# Patient Record
Sex: Male | Born: 1955 | Race: White | Hispanic: No | Marital: Single | Smoking: Never smoker
Health system: Southern US, Community
[De-identification: ages and names within clinical notes are randomized; demographics above are authoritative.]

## PROBLEM LIST (undated history)

## (undated) DIAGNOSIS — E7800 Pure hypercholesterolemia, unspecified: Secondary | ICD-10-CM

## (undated) DIAGNOSIS — T7840XA Allergy, unspecified, initial encounter: Secondary | ICD-10-CM

## (undated) HISTORY — PX: COLONOSCOPY W/ POLYPECTOMY: SHX1380

## (undated) HISTORY — PX: TONSILLECTOMY: SUR1361

## (undated) HISTORY — DX: Allergy, unspecified, initial encounter: T78.40XA

## (undated) HISTORY — PX: WISDOM TOOTH EXTRACTION: SHX21

## (undated) HISTORY — DX: Pure hypercholesterolemia, unspecified: E78.00

---

## 2003-10-24 ENCOUNTER — Encounter: Admission: RE | Admit: 2003-10-24 | Discharge: 2003-10-24 | Payer: Self-pay | Admitting: Family Medicine

## 2003-11-12 ENCOUNTER — Encounter: Admission: RE | Admit: 2003-11-12 | Discharge: 2003-11-12 | Payer: Self-pay | Admitting: Orthopedic Surgery

## 2009-05-13 ENCOUNTER — Emergency Department (HOSPITAL_COMMUNITY): Admission: EM | Admit: 2009-05-13 | Discharge: 2009-05-13 | Payer: Self-pay | Admitting: Emergency Medicine

## 2009-09-23 IMAGING — CT CT PELVIS W/O CM
2 of 4 series · 13 of 32 positions shown, 18 images · non-contrast
Comparison: None

CT ABDOMEN

CLINICAL DATA: Right-sided pain, nausea and vomiting.

CT ABDOMEN AND PELVIS WITHOUT CONTRAST
TECHNIQUE: Multidetector CT imaging of the abdomen and pelvis was
performed using the standard protocol without use of intravenous
contrast.

[Series 2: renal stone · axial · 0.75mm/px · z∈[-460,-144]mm · 5 of 94 slices shown, 10 images]
[im 16/94  soft-tissue]
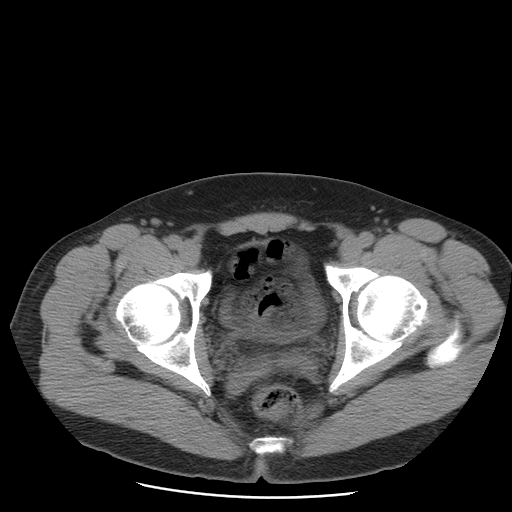
[im 16/94  bone]
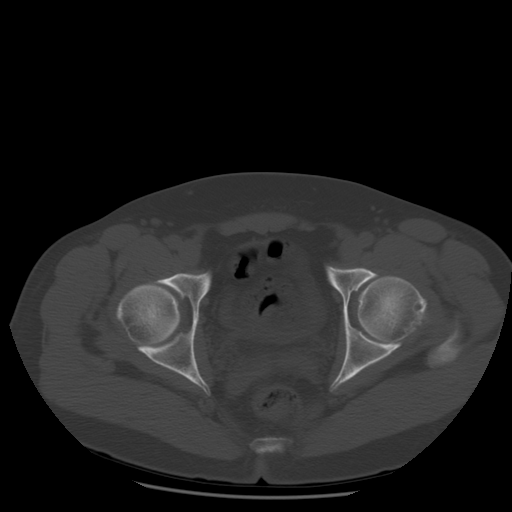
[im 32/94  soft-tissue]
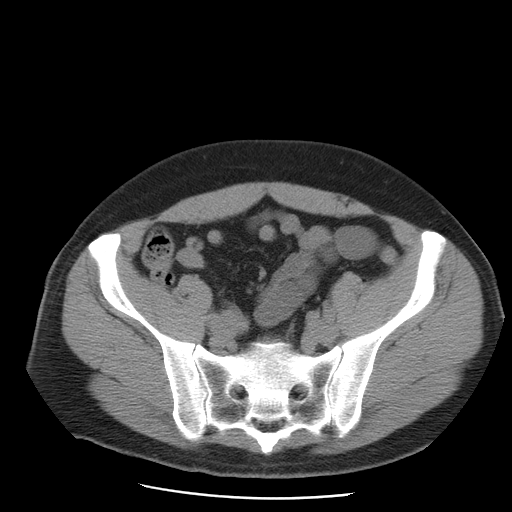
[im 32/94  lung]
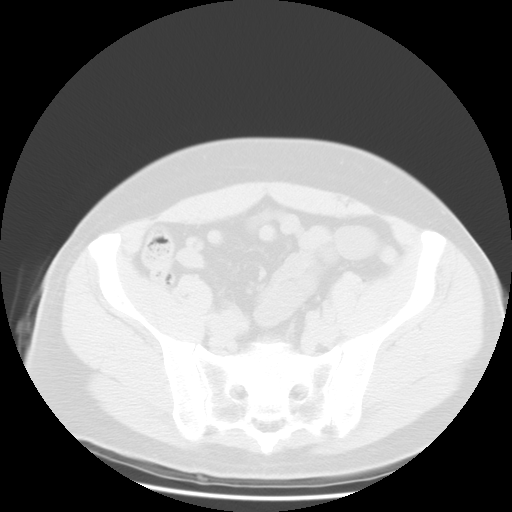
[im 47/94  soft-tissue]
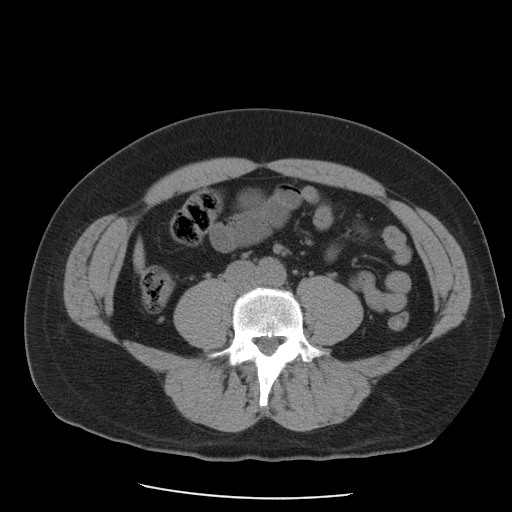
[im 47/94  lung]
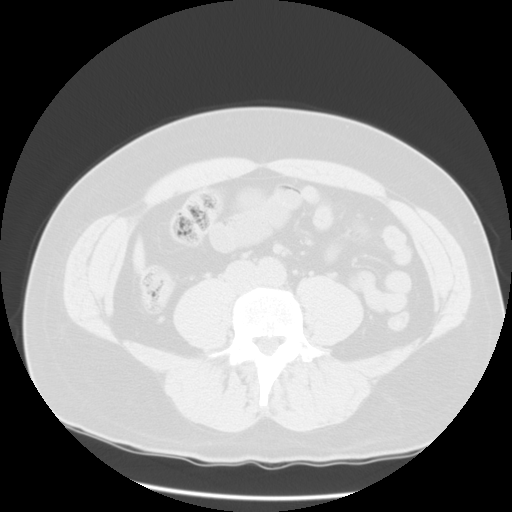
[im 63/94  soft-tissue]
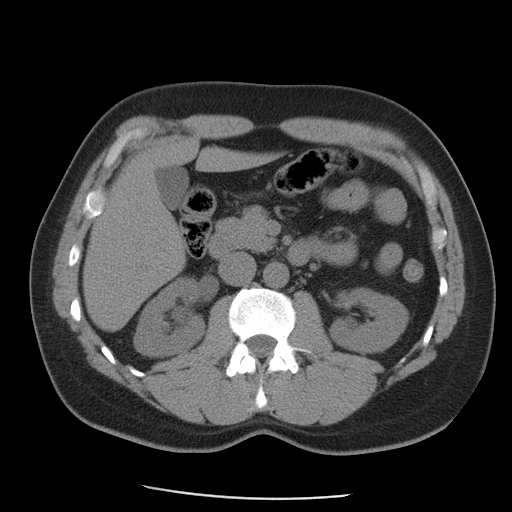
[im 63/94  lung]
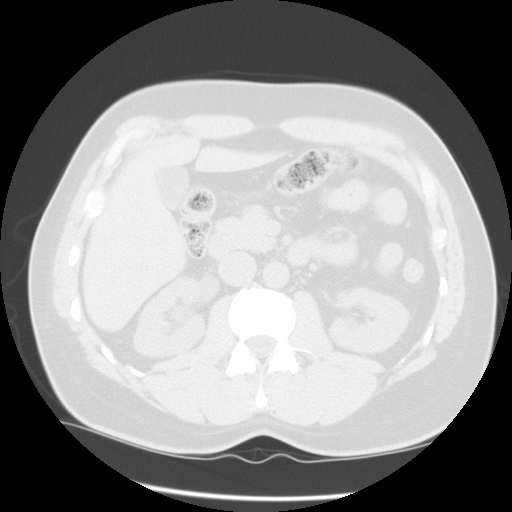
[im 78/94  soft-tissue]
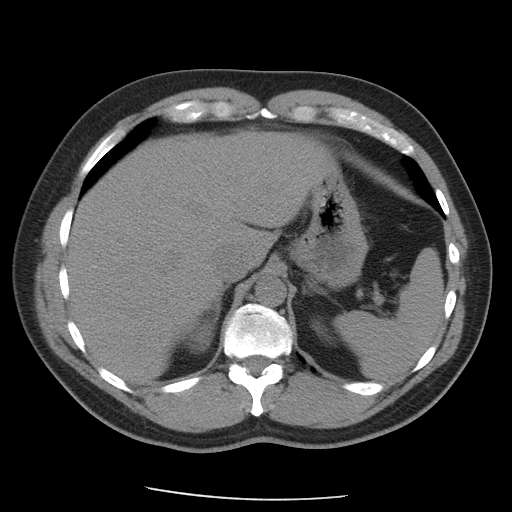
[im 78/94  lung]
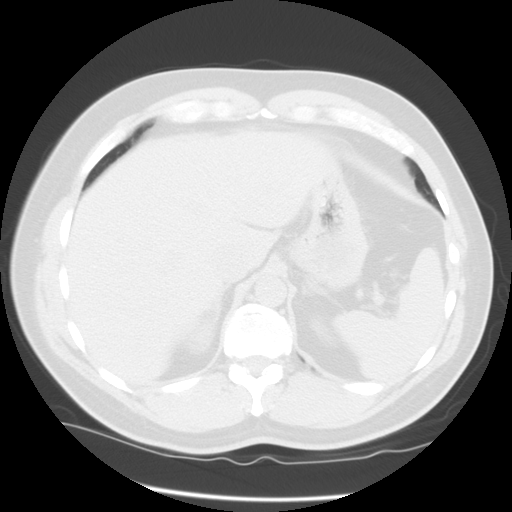

[Series 400: reformatted · sagittal · 0.97mm/px · 8 of 178 slices shown]
[im 15/178  soft-tissue]
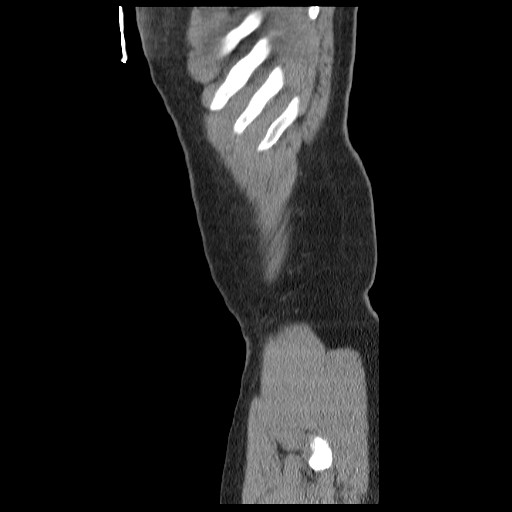
[im 45/178  soft-tissue]
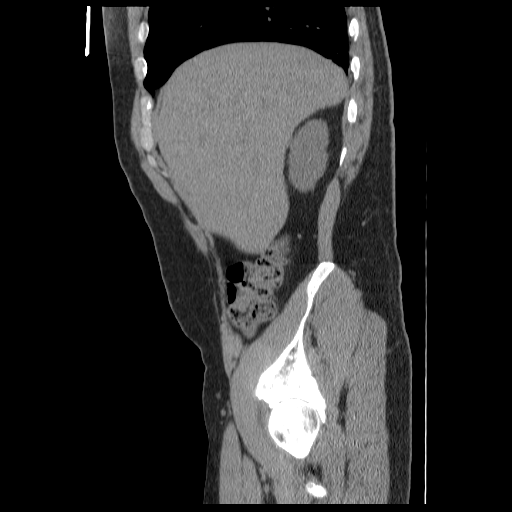
[im 60/178  soft-tissue]
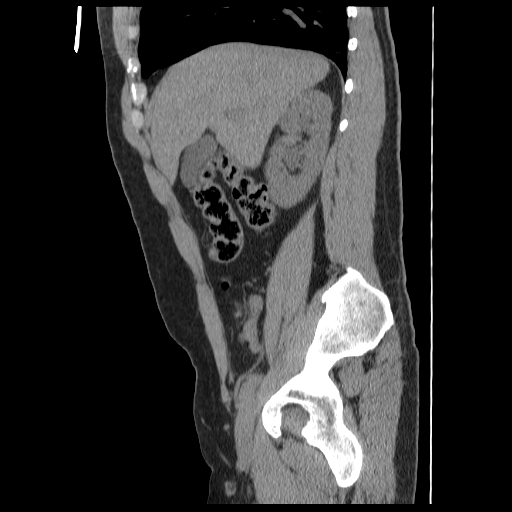
[im 74/178  soft-tissue]
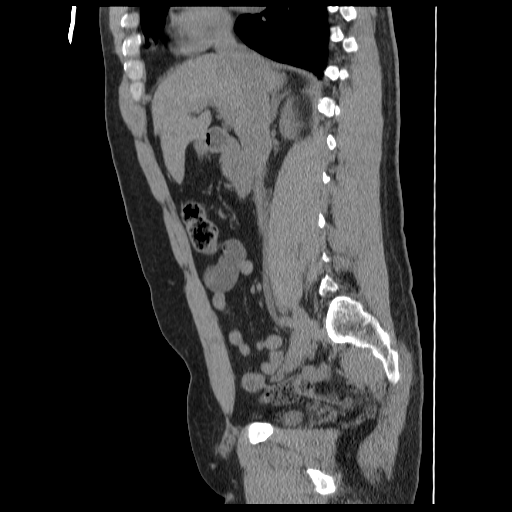
[im 104/178  soft-tissue]
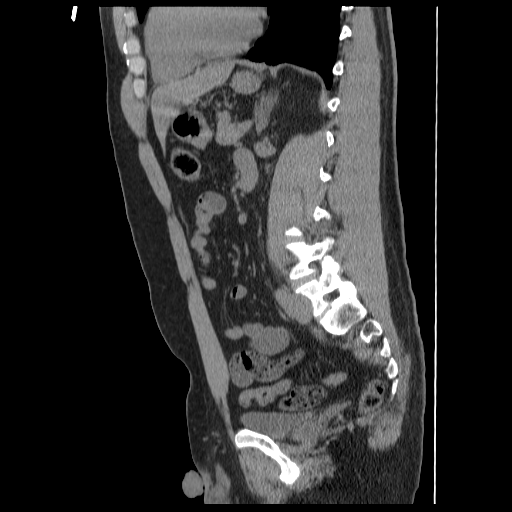
[im 119/178  soft-tissue]
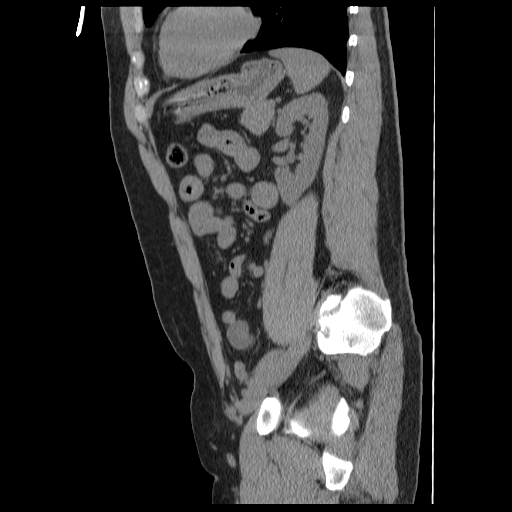
[im 133/178  soft-tissue]
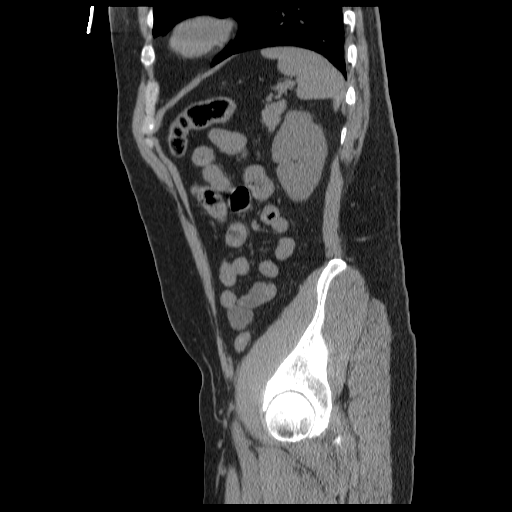
[im 163/178  soft-tissue]
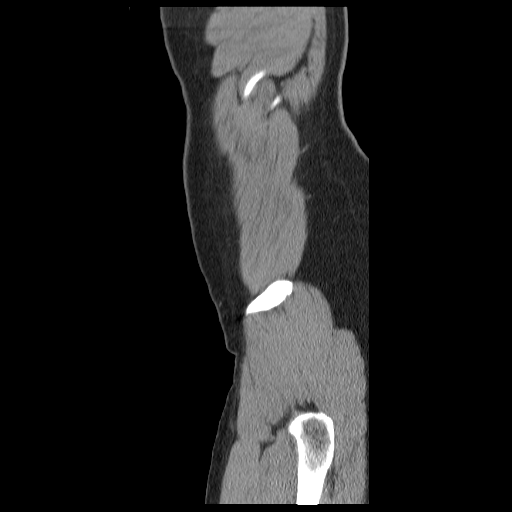

[13 of 32 positions shown; findings below may reference images not displayed]

FINDINGS: The visualized lung bases are clear.

The liver and spleen are unremarkable in appearance.  The
gallbladder is within normal limits.  The pancreas and adrenal
glands are unremarkable.

There is mild right-sided hydronephrosis, with prominence of the
right ureter along much of its course, to the level of a 3-4 mm
either obstructing or recently passed stone at the right
vesicoureteral junction. No nonobstructing renal stones are
identified.  The left kidney is unremarkable in appearance.

No free fluid is identified.  The small bowel is unremarkable in
appearance.  The stomach is within normal limits.  No acute
vascular abnormalities are seen.

No acute osseous abnormalities are identified.
IMPRESSION: Mild right-sided hydronephrosis and prominence of the right ureter,
with a 3-4 mm stone noted at the right vesicoureteral junction
along the posterior bladder wall.  This may be either an
obstructing stone or a recently passed stone. No additional stones
identified.

CT PELVIS
FINDINGS: No free fluid is identified within the pelvis.  The
colon is unremarkable in appearance; the sigmoid colon is redundant
within the pelvis.  The appendix is normal in caliber, and contains
air.

The bladder is largely decompressed and within normal limits. The
prostate is unremarkable in appearance.  There is no evidence of
inguinal lymphadenopathy.

No acute osseous abnormalities are seen.
IMPRESSION: Unremarkable CT of the pelvis.

## 2010-12-03 LAB — COMPREHENSIVE METABOLIC PANEL
ALT: 27 U/L (ref 0–53)
Alkaline Phosphatase: 57 U/L (ref 39–117)
CO2: 27 mEq/L (ref 19–32)
GFR calc non Af Amer: >60 mL/min (ref >60)
Glucose, Bld: 126 mg/dL — ABNORMAL HIGH (ref 70–99)
Potassium: 3.9 mEq/L (ref 3.5–5.1)
Sodium: 142 mEq/L (ref 135–145)
Total Protein: 6.7 g/dL (ref 6.0–8.3)

## 2010-12-03 LAB — URINALYSIS, ROUTINE W REFLEX MICROSCOPIC
Bilirubin Urine: NEGATIVE
Glucose, UA: NEGATIVE mg/dL
Ketones, ur: NEGATIVE mg/dL
Leukocytes, UA: NEGATIVE
Protein, ur: NEGATIVE mg/dL

## 2010-12-03 LAB — CBC
Hemoglobin: 15.4 g/dL (ref 13.0–17.0)
RBC: 4.96 MIL/uL (ref 4.22–5.81)
WBC: 7.3 10*3/uL (ref 4.0–10.5)

## 2010-12-03 LAB — DIFFERENTIAL
Basophils Relative: 1 % (ref 0–1)
Eosinophils Absolute: 0.1 10*3/uL (ref 0.0–0.7)
Monocytes Relative: 13 % — ABNORMAL HIGH (ref 3–12)
Neutrophils Relative %: 50 % (ref 43–77)

## 2012-06-11 ENCOUNTER — Encounter: Payer: Self-pay | Admitting: Gastroenterology

## 2012-07-30 ENCOUNTER — Encounter: Payer: Self-pay | Admitting: Gastroenterology

## 2012-08-31 ENCOUNTER — Encounter: Payer: Self-pay | Admitting: Gastroenterology

## 2012-10-02 ENCOUNTER — Encounter: Payer: Self-pay | Admitting: Gastroenterology

## 2012-10-02 ENCOUNTER — Ambulatory Visit (AMBULATORY_SURGERY_CENTER): Payer: 59

## 2012-10-02 VITALS — Ht 71.0 in | Wt 220.4 lb

## 2012-10-02 DIAGNOSIS — Z1211 Encounter for screening for malignant neoplasm of colon: Secondary | ICD-10-CM

## 2012-10-02 MED ORDER — NA SULFATE-K SULFATE-MG SULF 17.5-3.13-1.6 GM/177ML PO SOLN: 1.0000 | Freq: Once | ORAL | Status: DC

## 2012-10-16 ENCOUNTER — Ambulatory Visit (AMBULATORY_SURGERY_CENTER): Payer: 59 | Admitting: Gastroenterology

## 2012-10-16 ENCOUNTER — Encounter: Payer: Self-pay | Admitting: Gastroenterology

## 2012-10-16 VITALS — BP 113/69 | HR 51 | Temp 96.3°F | Resp 14 | Ht 70.0 in | Wt 220.0 lb

## 2012-10-16 DIAGNOSIS — D126 Benign neoplasm of colon, unspecified: Secondary | ICD-10-CM

## 2012-10-16 DIAGNOSIS — Z1211 Encounter for screening for malignant neoplasm of colon: Secondary | ICD-10-CM

## 2012-10-16 MED ORDER — SODIUM CHLORIDE 0.9 % IV SOLN: 500.0000 mL | INTRAVENOUS | Status: DC

## 2012-10-16 NOTE — Progress Notes (Signed)
Called to room to assist during endoscopic procedure.  Patient ID and intended procedure confirmed with present staff. Received instructions for my participation in the procedure from the performing physician.  

## 2012-10-16 NOTE — Op Note (Signed)
[facility] [address] [place], [address]   COLONOSCOPY PROCEDURE REPORT  PATIENT: Philip, [name]  MR#: [id number] BIRTHDATE: December 22, 1955 , 56  yrs. old GENDER: Male ENDOSCOPIST: Louis Meckel, MD REFERRED BY: PROCEDURE DATE:  10/16/2012 PROCEDURE:   Colonoscopy with snare polypectomy and Colonoscopy with cold biopsy polypectomy ASA CLASS:   Class I INDICATIONS:average risk screening. MEDICATIONS: MAC sedation, administered by CRNA and Propofol (Diprivan) 280 mg IV  DESCRIPTION OF PROCEDURE:   After the risks benefits and alternatives of the procedure were thoroughly explained, informed consent was obtained.  A digital rectal exam revealed no abnormalities of the rectum.   The LB CF-[id number]  endoscope was introduced through the anus and advanced to the cecum, which was identified by both the appendix and ileocecal valve. No adverse events experienced.   The quality of the prep was Suprep excellent The instrument was then slowly withdrawn as the colon was fully examined.      COLON FINDINGS: A sessile polyp measuring 10 mm in size was found in the ascending colon.  A polypectomy was performed with a cold snare.  The resection was complete and the polyp tissue was completely retrieved.   A sessile polyp measuring 3 mm in size was found in the descending colon.  A polypectomy was performed with a cold snare.  The resection was complete and the polyp tissue was completely retrieved.   A sessile polyp measuring 10 mm in size was found in the sigmoid colon.  the polyp was removed with a cold polypectomy snare. Polyp remnants at the base was removed with a cold biopsy forceps. Pieces were submitted to pathology.  The colon mucosa was otherwise normal.  Retroflexed views revealed no abnormalities. The time to cecum=2 minutes 21 seconds.  Withdrawal time=18 minutes 43 seconds.  The scope was withdrawn and the procedure completed. COMPLICATIONS:  There were no complications.  ENDOSCOPIC IMPRESSION: 1.   Sessile polyp measuring 10 mm in size was found in the ascending colon; polypectomy was performed with a cold snare 2.   Sessile polyp measuring 3 mm in size was found in the descending colon; polypectomy was performed with a cold snare 3.   Sessile polyp measuring 10 mm in size was found in the sigmoid colon; polypectomy was performed 4.   The colon mucosa was otherwise normal  RECOMMENDATIONS: colonoscopy 3 years  eSigned:  Louis Meckel, MD 10/16/2012 9:14 AM   cc:   PATIENT NAME:  Philip, [name] MR#: [id number]

## 2012-10-16 NOTE — Progress Notes (Signed)
Patient did not experience any of the following events: a burn prior to discharge; a fall within the facility; wrong site/side/patient/procedure/implant event; or a hospital transfer or hospital admission upon discharge from the facility. (G8907) Patient did not have preoperative order for IV antibiotic SSI prophylaxis. (G8918)  

## 2012-10-16 NOTE — Patient Instructions (Addendum)

## 2012-10-16 NOTE — Progress Notes (Signed)
A/ox3 pleased with MAC report to April RN 

## 2012-10-17 ENCOUNTER — Telehealth: Payer: Self-pay | Admitting: *Deleted

## 2012-10-17 NOTE — Telephone Encounter (Signed)
No identifier, left message, follow-up  

## 2012-10-23 ENCOUNTER — Encounter: Payer: Self-pay | Admitting: Gastroenterology

## 2015-11-04 ENCOUNTER — Encounter: Payer: Self-pay | Admitting: Gastroenterology

## 2016-01-01 ENCOUNTER — Telehealth: Payer: Self-pay | Admitting: *Deleted

## 2016-01-01 NOTE — Telephone Encounter (Signed)
Pt no show for previsit appointment 01/01/16 at 4pm. Phone call to patient, home number disconnected. Left message at work number to call and reschedule PV by 5 pm 01/04/16 or colonoscopy scheduled 01/15/16 would be cancelled.

## 2016-01-04 NOTE — Telephone Encounter (Signed)
Previsit rescheduled for 01/05/16.

## 2016-01-05 ENCOUNTER — Ambulatory Visit (AMBULATORY_SURGERY_CENTER): Payer: Self-pay

## 2016-01-05 ENCOUNTER — Encounter: Payer: Self-pay | Admitting: Gastroenterology

## 2016-01-05 VITALS — Ht 70.5 in | Wt 215.2 lb

## 2016-01-05 DIAGNOSIS — Z860100 Personal history of colon polyps, unspecified: Secondary | ICD-10-CM

## 2016-01-05 MED ORDER — SUPREP BOWEL PREP KIT 17.5-3.13-1.6 GM/177ML PO SOLN: 1.0000 | Freq: Once | ORAL | Status: DC

## 2016-01-05 NOTE — Progress Notes (Signed)
No allergies to eggs or soy No diet meds No home oxygen No past problems with anesthesia  Has email and internet; refused emmi

## 2016-01-15 ENCOUNTER — Encounter: Payer: Self-pay | Admitting: Gastroenterology

## 2016-01-15 ENCOUNTER — Ambulatory Visit (AMBULATORY_SURGERY_CENTER): Payer: 59 | Admitting: Gastroenterology

## 2016-01-15 VITALS — BP 100/62 | HR 100 | Temp 98.7°F | Resp 22 | Ht 70.0 in | Wt 215.0 lb

## 2016-01-15 DIAGNOSIS — D123 Benign neoplasm of transverse colon: Secondary | ICD-10-CM | POA: Diagnosis not present

## 2016-01-15 DIAGNOSIS — Z86010 Personal history of colonic polyps: Secondary | ICD-10-CM

## 2016-01-15 DIAGNOSIS — D122 Benign neoplasm of ascending colon: Secondary | ICD-10-CM | POA: Diagnosis not present

## 2016-01-15 MED ORDER — SODIUM CHLORIDE 0.9 % IV SOLN: 500.0000 mL | INTRAVENOUS | Status: DC

## 2016-01-15 NOTE — Patient Instructions (Addendum)
YOU HAD AN ENDOSCOPIC PROCEDURE TODAY AT THE [facility]:   Refer to the procedure report that was given to you for any specific questions about what was found during the examination.  If the procedure report does not answer your questions, please call your gastroenterologist to clarify.  If you requested that your care partner not be given the details of your procedure findings, then the procedure report has been included in a sealed envelope for you to review at your convenience later.  YOU SHOULD EXPECT: Some feelings of bloating in the abdomen. Passage of more gas than usual.  Walking can help get rid of the air that was put into your GI tract during the procedure and reduce the bloating. If you had a lower endoscopy (such as a colonoscopy or flexible sigmoidoscopy) you may notice spotting of blood in your stool or on the toilet paper. If you underwent a bowel prep for your procedure, you may not have a normal bowel movement for a few days.  Please Note:  You might notice some irritation and congestion in your nose or some drainage.  This is from the oxygen used during your procedure.  There is no need for concern and it should clear up in a day or so.  SYMPTOMS TO REPORT IMMEDIATELY:   Following lower endoscopy (colonoscopy or flexible sigmoidoscopy):  Excessive amounts of blood in the stool  Significant tenderness or worsening of abdominal pains  Swelling of the abdomen that is new, acute  Fever of 100F or higher   For urgent or emergent issues, a gastroenterologist can be reached at any hour by calling [phone].   DIET: Your first meal following the procedure should be a small meal and then it is ok to progress to your normal diet. Heavy or fried foods are harder to digest and may make you feel nauseous or bloated.  Likewise, meals heavy in dairy and vegetables can increase bloating.  Drink plenty of fluids but you should avoid alcoholic beverages for 24 hours. Try to  increase the fiber in your diet, and drink plenty of water.  ACTIVITY:  You should plan to take it easy for the rest of today and you should NOT DRIVE or use heavy machinery until tomorrow (because of the sedation medicines used during the test).    FOLLOW UP: Our staff will call the number listed on your records the next business day following your procedure to check on you and address any questions or concerns that you may have regarding the information given to you following your procedure. If we do not reach you, we will leave a message.  However, if you are feeling well and you are not experiencing any problems, there is no need to return our call.  We will assume that you have returned to your regular daily activities without incident.  If any biopsies were taken you will be contacted by phone or by letter within the next 1-3 weeks.  Please call us at [phone] if you have not heard about the biopsies in 3 weeks.    SIGNATURES/CONFIDENTIALITY: You and/or your care partner have signed paperwork which will be entered into your electronic medical record.  These signatures attest to the fact that that the information above on your After Visit Summary has been reviewed and is understood.  Full responsibility of the confidentiality of this discharge information lies with you and/or your care-partner.  Read all of the handouts given to you by your recovery   room nurse.  No NSAIDS for two weeks: aspirin, aleve nor ibuprofen.

## 2016-01-15 NOTE — Progress Notes (Signed)
Called to room to assist during endoscopic procedure.  Patient ID and intended procedure confirmed with present staff. Received instructions for my participation in the procedure from the performing physician.  

## 2016-01-15 NOTE — Progress Notes (Signed)
To pacu vss patent aw report to rn 

## 2016-01-15 NOTE — Op Note (Signed)
[facility] Patient Name: Philip [name] Procedure Date: 01/15/2016 11:14 AM MRN: [id number] Endoscopist: Remo Lipps P. Havery [name] , MD Age: 60 Referring MD:  Date of Birth: [birth year] Gender: Male Procedure:                Colonoscopy Indications:              Surveillance: Personal history of adenomatous                            polyps on last colonoscopy 3 years ago Medicines:                Monitored Anesthesia Care Procedure:                Pre-Anesthesia Assessment:                           - Prior to the procedure, a History and Physical                            was performed, and patient medications and                            allergies were reviewed. The patient's tolerance of                            previous anesthesia was also reviewed. The risks                            and benefits of the procedure and the sedation                            options and risks were discussed with the patient.                            All questions were answered, and informed consent                            was obtained. Prior Anticoagulants: The patient has                            taken aspirin, last dose was 1 day prior to                            procedure. ASA Grade Assessment: II - A patient                            with mild systemic disease. After reviewing the                            risks and benefits, the patient was deemed in                            satisfactory condition to undergo the procedure.  After obtaining informed consent, the colonoscope                            was passed under direct vision. Throughout the                            procedure, the patient's blood pressure, pulse, and                            oxygen saturations were monitored continuously. The                            Model CF-HQ190L [id number]) scope was introduced                            through the anus and advanced to the the  cecum,                            identified by appendiceal orifice and ileocecal                            valve. The colonoscopy was performed without                            difficulty. The patient tolerated the procedure                            well. The quality of the bowel preparation was                            good. The ileocecal valve, appendiceal orifice, and                            rectum were photographed. Scope In: 11:15:35 AM Scope Out: 11:27:34 AM Scope Withdrawal Time: 0 hours 10 minutes 1 second  Total Procedure Duration: 0 hours 11 minutes 59 seconds  Findings:                 The perianal exam findings include a perianal rash.                           A 5 mm polyp was found in the ascending colon. The                            polyp was sessile. The polyp was removed with a                            cold snare. Resection and retrieval were complete.                           A diminutive polyp was found in the transverse                            colon. The polyp was  sessile. The polyp was removed                            with a cold snare. Resection and retrieval were                            complete.                           A few small-mouthed diverticula were found in the                            left colon and right colon.                           Non-bleeding internal hemorrhoids were found during                            retroflexion. The hemorrhoids were small.                           The exam was otherwise without abnormality. Complications:            No immediate complications. Estimated blood loss:                            Minimal. Estimated Blood Loss:     Estimated blood loss was minimal. Impression:               - Perianal rash found on perianal exam. 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  Resected and retrieved.                           - One diminutive polyp in the transverse colon,                            removed with a cold snare. Resected and retrieved.                           - Diverticulosis in the left colon and in the right                            colon.                           - Non-bleeding internal hemorrhoids.                           -  The examination was otherwise normal. Recommendation:           - Patient has a contact number available for                            emergencies. The signs and symptoms of potential                            delayed complications were discussed with the                            patient. Return to normal activities tomorrow.                            Written discharge instructions were provided to the                            patient.                           - Resume previous diet.                           - Continue present medications.                           - No aspirin, ibuprofen, naproxen, or other                            non-steroidal anti-inflammatory drugs for 2 weeks                            after polyp removal.                           - Await pathology results.                           - Repeat colonoscopy is recommended for                            surveillance to be done in 5 years regardless of                            pathology results given multiple adenomas removed 3                            years ago                           - Follow up with primary care physician regarding                            possibility of psoriasis if you have never been                            evaluated  for this previously. Remo Lipps P. Kristl Morioka, MD 01/15/2016 11:32:45 AM This report has been signed electronically.

## 2016-01-18 ENCOUNTER — Telehealth: Payer: Self-pay | Admitting: *Deleted

## 2016-01-18 NOTE — Telephone Encounter (Signed)
  Follow up Call-  Call back number 01/15/2016  Post procedure Call Back phone  # [phone]  Permission to leave phone message Yes     Patient questions:  Message left to call us if necessary.

## 2016-01-20 ENCOUNTER — Encounter: Payer: Self-pay | Admitting: Gastroenterology

## 2016-10-25 DIAGNOSIS — Z0000 Encounter for general adult medical examination without abnormal findings: Secondary | ICD-10-CM | POA: Diagnosis not present

## 2016-10-25 DIAGNOSIS — Z125 Encounter for screening for malignant neoplasm of prostate: Secondary | ICD-10-CM | POA: Diagnosis not present

## 2016-10-25 DIAGNOSIS — E7800 Pure hypercholesterolemia, unspecified: Secondary | ICD-10-CM | POA: Diagnosis not present

## 2017-02-01 DIAGNOSIS — M545 Low back pain: Secondary | ICD-10-CM | POA: Diagnosis not present

## 2017-11-22 DIAGNOSIS — E7800 Pure hypercholesterolemia, unspecified: Secondary | ICD-10-CM | POA: Diagnosis not present

## 2017-11-22 DIAGNOSIS — Z125 Encounter for screening for malignant neoplasm of prostate: Secondary | ICD-10-CM | POA: Diagnosis not present

## 2017-11-22 DIAGNOSIS — Z1159 Encounter for screening for other viral diseases: Secondary | ICD-10-CM | POA: Diagnosis not present

## 2017-11-22 DIAGNOSIS — Z0000 Encounter for general adult medical examination without abnormal findings: Secondary | ICD-10-CM | POA: Diagnosis not present

## 2018-06-18 DIAGNOSIS — Z23 Encounter for immunization: Secondary | ICD-10-CM | POA: Diagnosis not present

## 2019-03-26 DIAGNOSIS — Z03818 Encounter for observation for suspected exposure to other biological agents ruled out: Secondary | ICD-10-CM | POA: Diagnosis not present

## 2019-03-26 DIAGNOSIS — Z209 Contact with and (suspected) exposure to unspecified communicable disease: Secondary | ICD-10-CM | POA: Diagnosis not present

## 2019-04-15 DIAGNOSIS — Z0000 Encounter for general adult medical examination without abnormal findings: Secondary | ICD-10-CM | POA: Diagnosis not present

## 2019-04-23 DIAGNOSIS — E7800 Pure hypercholesterolemia, unspecified: Secondary | ICD-10-CM | POA: Diagnosis not present

## 2019-04-23 DIAGNOSIS — Z125 Encounter for screening for malignant neoplasm of prostate: Secondary | ICD-10-CM | POA: Diagnosis not present

## 2019-07-22 DIAGNOSIS — Z20828 Contact with and (suspected) exposure to other viral communicable diseases: Secondary | ICD-10-CM | POA: Diagnosis not present

## 2019-10-10 ENCOUNTER — Ambulatory Visit: Payer: 59

## 2019-11-08 ENCOUNTER — Ambulatory Visit: Payer: BC Managed Care – PPO

## 2020-05-07 ENCOUNTER — Other Ambulatory Visit: Payer: Self-pay | Admitting: Family Medicine

## 2020-05-07 DIAGNOSIS — Z23 Encounter for immunization: Secondary | ICD-10-CM | POA: Diagnosis not present

## 2020-05-07 DIAGNOSIS — Z125 Encounter for screening for malignant neoplasm of prostate: Secondary | ICD-10-CM | POA: Diagnosis not present

## 2020-05-07 DIAGNOSIS — L820 Inflamed seborrheic keratosis: Secondary | ICD-10-CM | POA: Diagnosis not present

## 2020-05-07 DIAGNOSIS — Z0000 Encounter for general adult medical examination without abnormal findings: Secondary | ICD-10-CM | POA: Diagnosis not present

## 2020-05-07 DIAGNOSIS — E7800 Pure hypercholesterolemia, unspecified: Secondary | ICD-10-CM | POA: Diagnosis not present

## 2020-08-20 DIAGNOSIS — M545 Low back pain, unspecified: Secondary | ICD-10-CM | POA: Diagnosis not present

## 2020-10-26 DIAGNOSIS — F4320 Adjustment disorder, unspecified: Secondary | ICD-10-CM | POA: Diagnosis not present

## 2021-05-10 ENCOUNTER — Encounter: Payer: Self-pay | Admitting: Gastroenterology

## 2021-05-31 DIAGNOSIS — Z125 Encounter for screening for malignant neoplasm of prostate: Secondary | ICD-10-CM | POA: Diagnosis not present

## 2021-05-31 DIAGNOSIS — Z0000 Encounter for general adult medical examination without abnormal findings: Secondary | ICD-10-CM | POA: Diagnosis not present

## 2021-05-31 DIAGNOSIS — Z23 Encounter for immunization: Secondary | ICD-10-CM | POA: Diagnosis not present

## 2021-05-31 DIAGNOSIS — E7800 Pure hypercholesterolemia, unspecified: Secondary | ICD-10-CM | POA: Diagnosis not present

## 2021-07-14 ENCOUNTER — Encounter: Payer: Self-pay | Admitting: Gastroenterology

## 2021-08-17 ENCOUNTER — Telehealth: Payer: Self-pay

## 2021-08-17 ENCOUNTER — Ambulatory Visit: Payer: BC Managed Care – PPO

## 2021-08-17 NOTE — Telephone Encounter (Signed)
No show letter sent to patient via mail; PV and procedure appts cancelled as patient failed to call back to the office to reschedule PV appt prior to EOB day;

## 2021-08-17 NOTE — Telephone Encounter (Signed)
Multiple attempts made to reach patient at home and cell phone numbers; unable to reach patient and a message was left on cell phone; no show letter will be sent to the patient and PV and procedure appts will be cancelled if patient fails to call back to the office prior to end of business today;

## 2021-09-02 ENCOUNTER — Encounter: Payer: BC Managed Care – PPO | Admitting: Gastroenterology

## 2021-10-07 ENCOUNTER — Other Ambulatory Visit: Payer: Self-pay

## 2021-10-07 ENCOUNTER — Ambulatory Visit (AMBULATORY_SURGERY_CENTER): Payer: Self-pay

## 2021-10-07 VITALS — Ht 70.0 in | Wt 204.0 lb

## 2021-10-07 DIAGNOSIS — Z86010 Personal history of colonic polyps: Secondary | ICD-10-CM

## 2021-10-07 MED ORDER — PEG 3350-KCL-NA BICARB-NACL 420 G PO SOLR: 4000.0000 mL | Freq: Once | ORAL | 0 refills | Status: AC

## 2021-10-07 NOTE — Progress Notes (Signed)
Denies allergies to eggs or soy products. Denies complication of anesthesia or sedation. Denies use of weight loss medication. Denies use of O2.   Emmi instructions given for colonoscopy.  

## 2021-10-18 ENCOUNTER — Encounter: Payer: Self-pay | Admitting: Gastroenterology

## 2021-10-21 ENCOUNTER — Encounter: Payer: Self-pay | Admitting: Gastroenterology

## 2021-10-21 ENCOUNTER — Ambulatory Visit (AMBULATORY_SURGERY_CENTER): Payer: BC Managed Care – PPO | Admitting: Gastroenterology

## 2021-10-21 ENCOUNTER — Other Ambulatory Visit: Payer: Self-pay

## 2021-10-21 VITALS — BP 109/72 | HR 52 | Temp 97.1°F | Resp 11 | Ht 70.0 in | Wt 204.0 lb

## 2021-10-21 DIAGNOSIS — D123 Benign neoplasm of transverse colon: Secondary | ICD-10-CM

## 2021-10-21 DIAGNOSIS — Z1211 Encounter for screening for malignant neoplasm of colon: Secondary | ICD-10-CM | POA: Diagnosis not present

## 2021-10-21 DIAGNOSIS — D124 Benign neoplasm of descending colon: Secondary | ICD-10-CM | POA: Diagnosis not present

## 2021-10-21 DIAGNOSIS — Z86010 Personal history of colonic polyps: Secondary | ICD-10-CM | POA: Diagnosis not present

## 2021-10-21 MED ORDER — SODIUM CHLORIDE 0.9 % IV SOLN: 500.0000 mL | Freq: Once | INTRAVENOUS | Status: DC

## 2021-10-21 NOTE — Progress Notes (Signed)
Report given to PACU, vss 

## 2021-10-21 NOTE — Patient Instructions (Signed)
Handouts Provided:  Polyps and Diverticulosis ° °YOU HAD AN ENDOSCOPIC PROCEDURE TODAY AT THE [facility]:   Refer to the procedure report that was given to you for any specific questions about what was found during the examination.  If the procedure report does not answer your questions, please call your gastroenterologist to clarify.  If you requested that your care partner not be given the details of your procedure findings, then the procedure report has been included in a sealed envelope for you to review at your convenience later. ° °YOU SHOULD EXPECT: Some feelings of bloating in the abdomen. Passage of more gas than usual.  Walking can help get rid of the air that was put into your GI tract during the procedure and reduce the bloating. If you had a lower endoscopy (such as a colonoscopy or flexible sigmoidoscopy) you may notice spotting of blood in your stool or on the toilet paper. If you underwent a bowel prep for your procedure, you may not have a normal bowel movement for a few days. ° °Please Note:  You might notice some irritation and congestion in your nose or some drainage.  This is from the oxygen used during your procedure.  There is no need for concern and it should clear up in a day or so. ° °SYMPTOMS TO REPORT IMMEDIATELY: ° °Following lower endoscopy (colonoscopy or flexible sigmoidoscopy): ° Excessive amounts of blood in the stool ° Significant tenderness or worsening of abdominal pains ° Swelling of the abdomen that is new, acute ° Fever of 100°F or higher ° °For urgent or emergent issues, a gastroenterologist can be reached at any hour by calling [phone]. °Do not use MyChart messaging for urgent concerns.  ° ° °DIET:  We do recommend a small meal at first, but then you may proceed to your regular diet.  Drink plenty of fluids but you should avoid alcoholic beverages for 24 hours. ° °ACTIVITY:  You should plan to take it easy for the rest of today and you should NOT DRIVE  or use heavy machinery until tomorrow (because of the sedation medicines used during the test).   ° °FOLLOW UP: °Our staff will call the number listed on your records 48-72 hours following your procedure to check on you and address any questions or concerns that you may have regarding the information given to you following your procedure. If we do not reach you, we will leave a message.  We will attempt to reach you two times.  During this call, we will ask if you have developed any symptoms of COVID 19. If you develop any symptoms (ie: fever, flu-like symptoms, shortness of breath, cough etc.) before then, please call [phone].  If you test positive for Covid 19 in the 2 weeks post procedure, please call and report this information to us.   ° °If any biopsies were taken you will be contacted by phone or by letter within the next 1-3 weeks.  Please call us at [phone] if you have not heard about the biopsies in 3 weeks.  ° ° °SIGNATURES/CONFIDENTIALITY: °You and/or your care partner have signed paperwork which will be entered into your electronic medical record.  These signatures attest to the fact that that the information above on your After Visit Summary has been reviewed and is understood.  Full responsibility of the confidentiality of this discharge information lies with you and/or your care-partner. ° °

## 2021-10-21 NOTE — Progress Notes (Signed)
Called to room to assist during endoscopic procedure.  Patient ID and intended procedure confirmed with present staff. Received instructions for my participation in the procedure from the performing physician.  

## 2021-10-21 NOTE — Progress Notes (Signed)
[facility] History and Physical   Primary Care Physician:  Alroy Dust, L.Marlou Sa, MD   Reason for Procedure:   History of colon polyps  Plan:    colonoscopy     HPI: Philip [name] is a 66 y.o. male  here for colonoscopy surveillance - last exam 12/2015, a few adenomas. Patient denies any bowel symptoms at this time. Otherwise feels well without any cardiopulmonary symptoms.    Past Medical History:  Diagnosis Date   Allergy    Hypercholesteremia    no meds    Past Surgical History:  Procedure Laterality Date   COLONOSCOPY W/ POLYPECTOMY     TONSILLECTOMY     WISDOM TOOTH EXTRACTION      Prior to Admission medications   Medication Sig Start Date End Date Taking? Authorizing Provider  Calcium Carbonate-Vitamin D 600-200 MG-UNIT TABS Take by mouth. Take 2  Chewable tablets daily   Yes [provider]  Flaxseed, Linseed, (FLAXSEED OIL PO) Take by mouth. Take 2 pills daily   Yes [provider]  Multiple Vitamin (MULTIVITAMIN) tablet Take 1 tablet by mouth daily.   Yes [provider]  OVER THE COUNTER MEDICATION Olive Oil two teaspoons nightly.   Yes [provider]  pyridoxine (B-6) 100 MG tablet Take 100 mg by mouth daily.   Yes [provider]  vitamin C (ASCORBIC ACID) 500 MG tablet Take 500 mg by mouth daily.   Yes [provider]  vitamin E 1000 UNIT capsule Take 1,000 Units by mouth daily.   Yes [provider]    Current Outpatient Medications  Medication Sig Dispense Refill   Calcium Carbonate-Vitamin D 600-200 MG-UNIT TABS Take by mouth. Take 2  Chewable tablets daily     Flaxseed, Linseed, (FLAXSEED OIL PO) Take by mouth. Take 2 pills daily     Multiple Vitamin (MULTIVITAMIN) tablet Take 1 tablet by mouth daily.     OVER THE COUNTER MEDICATION Olive Oil two teaspoons nightly.     pyridoxine (B-6) 100 MG tablet Take 100 mg by mouth daily.     vitamin C (ASCORBIC ACID) 500 MG tablet Take  500 mg by mouth daily.     vitamin E 1000 UNIT capsule Take 1,000 Units by mouth daily.     Current Facility-Administered Medications  Medication Dose Route Frequency Provider Last Rate Last Admin   0.9 %  sodium chloride infusion  500 mL Intravenous Once Kallin Henk, Carlota Raspberry, MD        Allergies as of 10/21/2021   (No Known Allergies)    Family History  Problem Relation Age of Onset   Heart disease Mother    Diabetes Mother    Lung cancer Father    Diabetes Brother    Heart disease Maternal Grandmother    Diabetes Paternal Grandmother    Colon cancer Paternal Grandfather    Esophageal cancer Neg Hx    Rectal cancer Neg Hx    Stomach cancer Neg Hx     Social History   Socioeconomic History   Marital status: Single    Spouse name: Not on file   Number of children: Not on file   Years of education: Not on file   Highest education level: Not on file  Occupational History   Not on file  Tobacco Use   Smoking status: Never   Smokeless tobacco: Never  Vaping Use   Vaping Use: Never used  Substance and Sexual Activity   Alcohol use: Yes  Alcohol/week: 3.0 - 4.0 standard drinks    Types: 3 - 4 Cans of beer per week    Comment: weekends mostly beer   Drug use: No   Sexual activity: Not on file  Other Topics Concern   Not on file  Social History Narrative   Not on file   Social Determinants of Health   Financial Resource Strain: Not on file  Food Insecurity: Not on file  Transportation Needs: Not on file  Physical Activity: Not on file  Stress: Not on file  Social Connections: Not on file  Intimate Partner Violence: Not on file    Review of Systems: All other review of systems negative except as mentioned in the HPI.  Physical Exam: Vital signs BP 135/74    Pulse (!) 55    Temp (!) 97.1 F (36.2 C) (Temporal)    Resp 16    Ht 5\' 10"  (1.778 m)    Wt 204 lb (92.5 kg)    SpO2 99%    BMI 29.27 kg/m   General:   Alert,  Well-developed, pleasant and  cooperative in NAD Lungs:  Clear throughout to auscultation.   Heart:  Regular rate and rhythm Abdomen:  Soft, nontender and nondistended.   Neuro/Psych:  Alert and cooperative. Normal mood and affect. A and O x 3  Jolly Mango, MD [facility]

## 2021-10-21 NOTE — Op Note (Signed)
[facility] Patient Name: Philip [name] Procedure Date: 10/21/2021 9:00 AM MRN: [id number] Endoscopist: Remo Lipps P. Havery [name] , MD Age: 66 Referring MD:  Date of Birth: 1955-11-24 Gender: Male Account #: [id number] Procedure:                Colonoscopy Indications:              High risk colon cancer surveillance: Personal                            history of colonic polyps - 2 adenomas 12/2015 Medicines:                Monitored Anesthesia Care Procedure:                Pre-Anesthesia Assessment:                           - Prior to the procedure, a History and Physical                            was performed, and patient medications and                            allergies were reviewed. The patient's tolerance of                            previous anesthesia was also reviewed. The risks                            and benefits of the procedure and the sedation                            options and risks were discussed with the patient.                            All questions were answered, and informed consent                            was obtained. Prior Anticoagulants: The patient has                            taken no previous anticoagulant or antiplatelet                            agents. ASA Grade Assessment: II - A patient with                            mild systemic disease. After reviewing the risks                            and benefits, the patient was deemed in                            satisfactory condition to undergo the procedure.  After obtaining informed consent, the colonoscope                            was passed under direct vision. Throughout the                            procedure, the patient's blood pressure, pulse, and                            oxygen saturations were monitored continuously. The                            Olympus CF-HQ190L ([id number]) Colonoscope was                            introduced  through the anus and advanced to the the                            cecum, identified by appendiceal orifice and                            ileocecal valve. The colonoscopy was performed                            without difficulty. The patient tolerated the                            procedure well. The quality of the bowel                            preparation was good. The ileocecal valve,                            appendiceal orifice, and rectum were photographed. Scope In: 9:10:41 AM Scope Out: 9:23:01 AM Scope Withdrawal Time: 0 hours 10 minutes 19 seconds  Total Procedure Duration: 0 hours 12 minutes 20 seconds  Findings:                 The perianal exam findings include a rash in the                            gluteal cleft, most consistent with psoriasis                           A 3 mm polyp was found in the transverse colon. The                            polyp was sessile. The polyp was removed with a                            cold snare. Resection and retrieval were complete.                           Two sessile polyps were found  in the descending                            colon. The polyps were 2 to 3 mm in size. These                            polyps were removed with a cold snare. Resection                            and retrieval were complete.                           Multiple small-mouthed diverticula were found in                            the sigmoid colon.                           Internal hemorrhoids were found during retroflexion.                           The exam was otherwise without abnormality. Complications:            No immediate complications. Estimated blood loss:                            Minimal. Estimated Blood Loss:     Estimated blood loss was minimal. Impression:               - Rash in the gluteal cleft, psoriasis?                           - One 3 mm polyp in the transverse colon, removed                            with a cold snare.  Resected and retrieved.                           - Two 2 to 3 mm polyps in the descending colon,                            removed with a cold snare. Resected and retrieved.                           - Diverticulosis in the sigmoid colon.                           - Internal hemorrhoids.                           - The examination was otherwise normal. Recommendation:           - Patient has a contact number available for                            emergencies. The signs and symptoms  of potential                            delayed complications were discussed with the                            patient. Return to normal activities tomorrow.                            Written discharge instructions were provided to the                            patient.                           - Resume previous diet.                           - Continue present medications.                           - Await pathology results.                           - See PCP or dermatology about gluteal cleft rash                            if no prior diagnosis of psoriasis Philip [name] P. Havery [name], MD 10/21/2021 9:27:36 AM This report has been signed electronically.

## 2021-10-21 NOTE — Progress Notes (Signed)
VS-CW  Pt's states no medical or surgical changes since previsit or office visit.  

## 2021-10-25 ENCOUNTER — Telehealth: Payer: Self-pay

## 2021-10-25 NOTE — Telephone Encounter (Signed)
°  Follow up Call-  Call back number 10/21/2021  Post procedure Call Back phone  # [phone]  Permission to leave phone message Yes  Some recent data might be hidden     Patient questions:  Do you have a fever, pain , or abdominal swelling? No. Pain Score  0 *  Have you tolerated food without any problems? Yes.    Have you been able to return to your normal activities? Yes.    Do you have any questions about your discharge instructions: Diet   No. Medications  No. Follow up visit  No.  Do you have questions or concerns about your Care? No.  Actions: * If pain score is 4 or above: No action needed, pain <4.

## 2022-06-09 DIAGNOSIS — Z125 Encounter for screening for malignant neoplasm of prostate: Secondary | ICD-10-CM | POA: Diagnosis not present

## 2022-06-09 DIAGNOSIS — E7800 Pure hypercholesterolemia, unspecified: Secondary | ICD-10-CM | POA: Diagnosis not present

## 2022-06-09 DIAGNOSIS — Z0000 Encounter for general adult medical examination without abnormal findings: Secondary | ICD-10-CM | POA: Diagnosis not present

## 2022-06-09 DIAGNOSIS — Z23 Encounter for immunization: Secondary | ICD-10-CM | POA: Diagnosis not present

## 2023-06-15 DIAGNOSIS — E7800 Pure hypercholesterolemia, unspecified: Secondary | ICD-10-CM | POA: Diagnosis not present

## 2023-06-15 DIAGNOSIS — Z1159 Encounter for screening for other viral diseases: Secondary | ICD-10-CM | POA: Diagnosis not present

## 2023-06-15 DIAGNOSIS — Z125 Encounter for screening for malignant neoplasm of prostate: Secondary | ICD-10-CM | POA: Diagnosis not present

## 2023-06-15 DIAGNOSIS — Z0000 Encounter for general adult medical examination without abnormal findings: Secondary | ICD-10-CM | POA: Diagnosis not present

## 2023-06-15 DIAGNOSIS — Z23 Encounter for immunization: Secondary | ICD-10-CM | POA: Diagnosis not present

## 2023-11-06 DIAGNOSIS — L989 Disorder of the skin and subcutaneous tissue, unspecified: Secondary | ICD-10-CM | POA: Diagnosis not present

## 2024-03-14 DIAGNOSIS — K080 Exfoliation of teeth due to systemic causes: Secondary | ICD-10-CM | POA: Diagnosis not present

## 2024-08-05 DIAGNOSIS — E785 Hyperlipidemia, unspecified: Secondary | ICD-10-CM | POA: Diagnosis not present

## 2024-08-05 DIAGNOSIS — Z125 Encounter for screening for malignant neoplasm of prostate: Secondary | ICD-10-CM | POA: Diagnosis not present

## 2024-08-05 DIAGNOSIS — R030 Elevated blood-pressure reading, without diagnosis of hypertension: Secondary | ICD-10-CM | POA: Diagnosis not present

## 2024-08-05 DIAGNOSIS — Z833 Family history of diabetes mellitus: Secondary | ICD-10-CM | POA: Diagnosis not present

## 2024-08-07 DIAGNOSIS — E785 Hyperlipidemia, unspecified: Secondary | ICD-10-CM | POA: Diagnosis not present

## 2024-08-07 DIAGNOSIS — R030 Elevated blood-pressure reading, without diagnosis of hypertension: Secondary | ICD-10-CM | POA: Diagnosis not present

## 2024-08-07 DIAGNOSIS — Z833 Family history of diabetes mellitus: Secondary | ICD-10-CM | POA: Diagnosis not present

## 2024-08-07 DIAGNOSIS — Z125 Encounter for screening for malignant neoplasm of prostate: Secondary | ICD-10-CM | POA: Diagnosis not present
